# Patient Record
Sex: Female | Born: 1965
Health system: Southern US, Community
[De-identification: ages and names within clinical notes are randomized; demographics above are authoritative.]

## PROBLEM LIST (undated history)

## (undated) ENCOUNTER — Emergency Department: Admission: EM | Source: Home / Self Care

## (undated) DIAGNOSIS — M549 Dorsalgia, unspecified: Secondary | ICD-10-CM

## (undated) DIAGNOSIS — G43909 Migraine, unspecified, not intractable, without status migrainosus: Secondary | ICD-10-CM

## (undated) DIAGNOSIS — R42 Dizziness and giddiness: Secondary | ICD-10-CM

## (undated) HISTORY — PX: ABDOMINAL HYSTERECTOMY: SHX81

---

## 2006-06-22 ENCOUNTER — Ambulatory Visit: Payer: Self-pay

## 2007-07-01 ENCOUNTER — Ambulatory Visit: Payer: Self-pay

## 2008-05-29 ENCOUNTER — Emergency Department (HOSPITAL_COMMUNITY): Admission: EM | Admit: 2008-05-29 | Discharge: 2008-05-29 | Payer: Self-pay | Admitting: Family Medicine

## 2008-07-03 ENCOUNTER — Ambulatory Visit: Payer: Self-pay

## 2009-06-13 ENCOUNTER — Encounter: Admission: RE | Admit: 2009-06-13 | Discharge: 2009-08-09 | Payer: Self-pay | Admitting: Occupational Medicine

## 2009-06-29 ENCOUNTER — Ambulatory Visit (HOSPITAL_COMMUNITY): Admission: RE | Admit: 2009-06-29 | Discharge: 2009-06-29 | Payer: Self-pay | Admitting: Internal Medicine

## 2009-07-04 ENCOUNTER — Ambulatory Visit: Payer: Self-pay

## 2009-11-15 ENCOUNTER — Ambulatory Visit: Payer: Self-pay | Admitting: Unknown Physician Specialty

## 2010-09-21 ENCOUNTER — Encounter: Payer: Self-pay | Admitting: Unknown Physician Specialty

## 2010-09-22 ENCOUNTER — Encounter: Payer: Self-pay | Admitting: Internal Medicine

## 2013-01-17 ENCOUNTER — Encounter (HOSPITAL_COMMUNITY): Payer: Self-pay | Admitting: *Deleted

## 2013-01-17 ENCOUNTER — Emergency Department (HOSPITAL_COMMUNITY)
Admission: EM | Admit: 2013-01-17 | Discharge: 2013-01-17 | Disposition: A | Discharge status: Home / Self Care | Payer: Self-pay | Attending: Emergency Medicine | Admitting: Emergency Medicine

## 2013-01-17 DIAGNOSIS — M545 Low back pain, unspecified: Secondary | ICD-10-CM | POA: Insufficient documentation

## 2013-01-17 DIAGNOSIS — Z8679 Personal history of other diseases of the circulatory system: Secondary | ICD-10-CM | POA: Insufficient documentation

## 2013-01-17 DIAGNOSIS — M549 Dorsalgia, unspecified: Secondary | ICD-10-CM

## 2013-01-17 DIAGNOSIS — F17200 Nicotine dependence, unspecified, uncomplicated: Secondary | ICD-10-CM | POA: Insufficient documentation

## 2013-01-17 HISTORY — DX: Dorsalgia, unspecified: M54.9

## 2013-01-17 HISTORY — DX: Migraine, unspecified, not intractable, without status migrainosus: G43.909

## 2013-01-17 HISTORY — DX: Dizziness and giddiness: R42

## 2013-01-17 MED ORDER — CYCLOBENZAPRINE HCL 10 MG PO TABS: 10.0000 mg | ORAL_TABLET | Freq: Two times a day (BID) | ORAL | Status: AC | PRN

## 2013-01-17 MED ORDER — HYDROCODONE-ACETAMINOPHEN 5-325 MG PO TABS: 1.0000 | ORAL_TABLET | Freq: Four times a day (QID) | ORAL | Status: AC | PRN

## 2013-01-17 MED ORDER — NAPROXEN 500 MG PO TABS: 500.0000 mg | ORAL_TABLET | Freq: Two times a day (BID) | ORAL | Status: AC

## 2013-01-17 MED ORDER — HYDROCODONE-ACETAMINOPHEN 5-325 MG PO TABS
1.0000 | ORAL_TABLET | Freq: Once | ORAL | Status: AC
  Administered 2013-01-17: 1 via ORAL
  Filled 2013-01-17: qty 1

## 2013-01-17 NOTE — ED Notes (Signed)
MD at bedside. 

## 2013-01-17 NOTE — ED Notes (Signed)
Chronic lumbar back pain, flare up Friday.  Normally on Meloxicam and Cyclobenzaprine, but is out of meds.  Hx of herniated disc w/nerve impingement.

## 2013-01-17 NOTE — ED Provider Notes (Signed)
History     This chart was scribed for Shelda Jakes, MD, MD by Smitty Pluck, ED Scribe. The patient was seen in room APA03/APA03 and the patient's care was started at 11:14 AM.   CSN: [id number]  Arrival date & time 01/17/13  6213      Chief Complaint  Patient presents with  . Back Pain    Patient is a 47 y.o. female presenting with back pain. The history is provided by the patient and medical records. No language interpreter was used.  Back Pain Location:  Lumbar spine Quality:  Unable to specify Radiates to:  R knee Pain severity:  Moderate Onset quality:  Sudden Duration:  3 days Timing:  Constant Progression:  Worsening Chronicity:  Chronic Context: not falling, not jumping from heights, not MCA, not MVA and not recent injury   Relieved by:  None tried Ineffective treatments:  None tried Associated symptoms: no dysuria and no fever    HPI Comments: Kathy Choi is a 47 y.o. female who presents to the Emergency Department complaining of constant, moderate lower back pain radiating to right knee and entire left leg that have been chronic for years but worsening 3 days ago. Pt reports that she use to be treated by Dr. Claybon Jabs but is unable to see him because she does not have insurance. Pt states has taken Meloxicam in the past but has not had it within the past year. Pt denies fever, chills, nausea, numbness, vomiting, urinary/bowel incontinence, diarrhea, weakness, cough, SOB and any other pain.   Past Medical History  Diagnosis Date  . Migraines   . Back pain   . Vertigo     Past Surgical History  Procedure Laterality Date  . Abdominal hysterectomy      History reviewed. No pertinent family history.  History  Substance Use Topics  . Smoking status: Current Every Day Smoker -- 0.50 packs/day  . Smokeless tobacco: Not on file  . Alcohol Use: No    OB History   Grav Para Term Preterm Abortions TAB SAB Ect Mult Living   2 2 2              Review  of Systems  Constitutional: Negative for fever and chills.  HENT: Negative for rhinorrhea.   Respiratory: Negative for cough and shortness of breath.   Cardiovascular: Negative for leg swelling.  Gastrointestinal: Negative for nausea, vomiting and diarrhea.  Genitourinary: Negative for dysuria.  Musculoskeletal: Positive for back pain.  All other systems reviewed and are negative.    Allergies  Sulfa antibiotics  Home Medications   Current Outpatient Rx  Name  Route  Sig  Dispense  Refill  . acetaminophen (TYLENOL) 500 MG tablet   Oral   Take 1,000 mg by mouth every 6 (six) hours as needed for pain.         . cyclobenzaprine (FLEXERIL) 10 MG tablet   Oral   Take 1 tablet (10 mg total) by mouth 2 (two) times daily as needed for muscle spasms.   20 tablet   0   . HYDROcodone-acetaminophen (NORCO/VICODIN) 5-325 MG per tablet   Oral   Take 1-2 tablets by mouth every 6 (six) hours as needed for pain.   20 tablet   0   . naproxen (NAPROSYN) 500 MG tablet   Oral   Take 1 tablet (500 mg total) by mouth 2 (two) times daily.   14 tablet   0     BP 146/99  Pulse 98  Temp(Src) 98.5 F (36.9 C) (Oral)  Ht 5\' 4"  (1.626 m)  Wt 190 lb (86.183 kg)  BMI 32.6 kg/m2  SpO2 99%  Physical Exam  Nursing note and vitals reviewed. Constitutional: She is oriented to person, place, and time. She appears well-developed and well-nourished. No distress.  HENT:  Head: Normocephalic and atraumatic.  Eyes: EOM are normal. Pupils are equal, round, and reactive to light.  Neck: Normal range of motion. Neck supple. No tracheal deviation present.  Cardiovascular: Normal rate, regular rhythm and normal heart sounds.   No murmur heard. Pulmonary/Chest: Effort normal. No respiratory distress.  Abdominal: Soft. Bowel sounds are normal. She exhibits no distension. There is no tenderness. There is no rebound.  Musculoskeletal: Normal range of motion.  No muscle spasm on right lumbar paraspinal  area or left lumbar paraspinal area Tenderness to left lumbar paraspinal area   Lymphadenopathy:    She has no cervical adenopathy.  Neurological: She is alert and oriented to person, place, and time. No cranial nerve deficit. Coordination normal.  Skin: Skin is warm and dry.  Psychiatric: She has a normal mood and affect. Her behavior is normal.    ED Course  Procedures (including critical care time) DIAGNOSTIC STUDIES: Oxygen Saturation is 99% on room air, normal by my interpretation.    COORDINATION OF CARE: 9:48 AM Discussed ED treatment with pt and pt agrees.  Medications  HYDROcodone-acetaminophen (NORCO/VICODIN) 5-325 MG per tablet 1 tablet (1 tablet Oral Given 01/17/13 1122)      Labs Reviewed - No data to display No results found.   1. Back pain       MDM  Patient with long-standing history of low back pain. Worse exacerbation in the last week. No new injury. Patient has been successfully treated in the past with Flexeril and anti-inflammatories. Pain does radiate down the left leg may represent left-sided sciatica. No numbness or weakness in the left foot. Will treat with anti-inflammatories pain medicine and muscle relaxer.      I personally performed the services described in this documentation, which was scribed in my presence. The recorded information has been reviewed and is accurate.     Shelda Jakes, MD 01/17/13 [phone]

## 2014-07-03 ENCOUNTER — Encounter (HOSPITAL_COMMUNITY): Payer: Self-pay | Admitting: *Deleted

## 2015-01-26 ENCOUNTER — Ambulatory Visit (HOSPITAL_COMMUNITY)
Admission: RE | Admit: 2015-01-26 | Discharge: 2015-01-26 | Disposition: A | Discharge status: Home / Self Care | Payer: Self-pay | Source: Ambulatory Visit | Attending: Nurse Practitioner | Admitting: Nurse Practitioner

## 2015-01-26 ENCOUNTER — Other Ambulatory Visit (HOSPITAL_COMMUNITY): Payer: Self-pay | Admitting: Nurse Practitioner

## 2015-01-26 DIAGNOSIS — M5440 Lumbago with sciatica, unspecified side: Secondary | ICD-10-CM

## 2015-03-23 ENCOUNTER — Other Ambulatory Visit (HOSPITAL_COMMUNITY): Payer: Self-pay | Admitting: Nurse Practitioner

## 2015-03-23 DIAGNOSIS — Z1231 Encounter for screening mammogram for malignant neoplasm of breast: Secondary | ICD-10-CM

## 2015-04-04 ENCOUNTER — Ambulatory Visit (HOSPITAL_COMMUNITY)
Admission: RE | Admit: 2015-04-04 | Discharge: 2015-04-04 | Disposition: A | Discharge status: Home / Self Care | Payer: Self-pay | Source: Ambulatory Visit | Attending: Nurse Practitioner | Admitting: Nurse Practitioner

## 2015-04-04 DIAGNOSIS — Z1231 Encounter for screening mammogram for malignant neoplasm of breast: Secondary | ICD-10-CM

## 2015-04-05 ENCOUNTER — Other Ambulatory Visit: Payer: Self-pay | Admitting: Nurse Practitioner

## 2015-04-05 DIAGNOSIS — R928 Other abnormal and inconclusive findings on diagnostic imaging of breast: Secondary | ICD-10-CM

## 2015-04-10 ENCOUNTER — Ambulatory Visit: Payer: Self-pay | Admitting: Orthopedic Surgery

## 2015-04-17 ENCOUNTER — Ambulatory Visit (HOSPITAL_COMMUNITY)
Admission: RE | Admit: 2015-04-17 | Discharge: 2015-04-17 | Disposition: A | Discharge status: Home / Self Care | Payer: Self-pay | Source: Ambulatory Visit | Attending: Nurse Practitioner | Admitting: Nurse Practitioner

## 2015-04-17 ENCOUNTER — Ambulatory Visit (INDEPENDENT_AMBULATORY_CARE_PROVIDER_SITE_OTHER): Payer: Self-pay | Admitting: Orthopedic Surgery

## 2015-04-17 ENCOUNTER — Encounter: Payer: Self-pay | Admitting: Orthopedic Surgery

## 2015-04-17 VITALS — BP 127/86 | Ht 64.0 in | Wt 189.0 lb

## 2015-04-17 DIAGNOSIS — M5126 Other intervertebral disc displacement, lumbar region: Secondary | ICD-10-CM

## 2015-04-17 DIAGNOSIS — R928 Other abnormal and inconclusive findings on diagnostic imaging of breast: Secondary | ICD-10-CM

## 2015-04-17 NOTE — Progress Notes (Signed)
[facility]   Chief Complaint  Patient presents with  . Back Pain    Low back pain, referred by Dr. Sedonia Small for eval and treat.    History. The patient presents with a seven-year history of chronic back pain she says it always a 10. The pain is located in the lower back and radiates down the left leg into the left foot. She says the whole leg feels numb. Severity again is a 10 duration as stated quality of pain described as sharp dull throbbing burning stabbing aching radiating associated with numbness tingling giving out of the left leg stiffness of the back and locking  Previous treatment I am cortisone shot 3, physical therapy, medications of cyclobenzaprine and diclofenac.  Patient reports inability to sit for long periods stand for long periods walk or lift anything of any weight.  Review of systems dental problems vision problems or neurologic problems as stated depression anxiety skin rashes  Past Medical History  Diagnosis Date  . Migraines   . Back pain   . Vertigo     BP 127/86 mmHg  Ht  (1.626 m)  Wt 189 lb (85.73 kg)  BMI 32.43 kg/m2 She is a well-developed well-nourished her overall body habitus is ectomorphic Oriented 3 mood pleasant Gait unremarkable  Lumbar spine tenderness thoracic spine no tenderness cervical spine no tenderness, lumbar spine tenderness is severe she has left gluteal tenderness left SI joint tenderness  The right and left lower extremity her foot reveal normal strength at the ankle and knee weakness at the left hip flexor Hip knees and ankles are stable for range of motion passively  Skin normal back and legs  Decreased sensation left leg L5 normal on the right  Dorsalis pedis 2+ in each leg/foot  Imaging She presented with lumbar spine films and report. I reviewed these as and independently look at this as mild degenerative disc disease  Recommend MRI to evaluate her spine.  X-ray results will be called to the  patient and then referral will be made

## 2015-04-17 NOTE — Patient Instructions (Signed)
We will schedule MRI for you and call you with appt and results 

## 2015-05-02 ENCOUNTER — Ambulatory Visit (HOSPITAL_COMMUNITY)
Admission: RE | Admit: 2015-05-02 | Discharge: 2015-05-02 | Disposition: A | Discharge status: Home / Self Care | Payer: Self-pay | Source: Ambulatory Visit | Attending: Orthopedic Surgery | Admitting: Orthopedic Surgery

## 2015-05-02 DIAGNOSIS — M5136 Other intervertebral disc degeneration, lumbar region: Secondary | ICD-10-CM | POA: Insufficient documentation

## 2015-05-02 DIAGNOSIS — M545 Low back pain: Secondary | ICD-10-CM | POA: Insufficient documentation

## 2015-05-02 DIAGNOSIS — M5126 Other intervertebral disc displacement, lumbar region: Secondary | ICD-10-CM

## 2015-05-02 DIAGNOSIS — M79605 Pain in left leg: Secondary | ICD-10-CM | POA: Insufficient documentation

## 2015-05-02 DIAGNOSIS — M1288 Other specific arthropathies, not elsewhere classified, other specified site: Secondary | ICD-10-CM | POA: Insufficient documentation

## 2015-05-22 ENCOUNTER — Telehealth: Payer: Self-pay | Admitting: Orthopedic Surgery

## 2015-05-22 NOTE — Telephone Encounter (Signed)
My note says its a back problem   So neurosurgery

## 2015-05-22 NOTE — Telephone Encounter (Signed)
What type of referral is this patient speaking of, i do not see it in the chart or notes?

## 2015-05-22 NOTE — Telephone Encounter (Signed)
Patient returned call per Dr Mort Sawyers message regarding MRI results; said was advised about referral?  Patient verified that she is still under [facility] care through [facility]; in this case, protocol is to have [facility] contact person, Ms. Valentina Lucks, make the referral.  Patient's ph#: [phone]

## 2015-05-23 NOTE — Telephone Encounter (Signed)
Dr Mort Sawyers recommendation faxed to [facility] @ [facility]

## 2015-06-12 ENCOUNTER — Telehealth: Payer: Self-pay | Admitting: Orthopedic Surgery

## 2015-06-12 NOTE — Telephone Encounter (Signed)
Notes refaxed as requested

## 2015-06-12 NOTE — Telephone Encounter (Signed)
Call received from [facility] of [facility] [employer], inquiring about a referral. States patient called her to follow up; please fax or re-fax to her attention to Fax#[phone]

## 2015-11-09 ENCOUNTER — Other Ambulatory Visit (HOSPITAL_COMMUNITY): Payer: Self-pay | Admitting: Internal Medicine

## 2015-11-09 DIAGNOSIS — N9489 Other specified conditions associated with female genital organs and menstrual cycle: Secondary | ICD-10-CM

## 2015-11-13 ENCOUNTER — Ambulatory Visit (HOSPITAL_COMMUNITY): Admission: RE | Admit: 2015-11-13 | Payer: Self-pay | Source: Ambulatory Visit

## 2016-12-12 IMAGING — MG MM SCREENING BREAST TOMO BILATERAL
3 series · 3 of 3 positions shown · non-contrast
Comparison: 07/04/2009

CLINICAL DATA: Screening.

EXAM:
DIGITAL SCREENING BILATERAL MAMMOGRAM WITH CAD

[R CC]
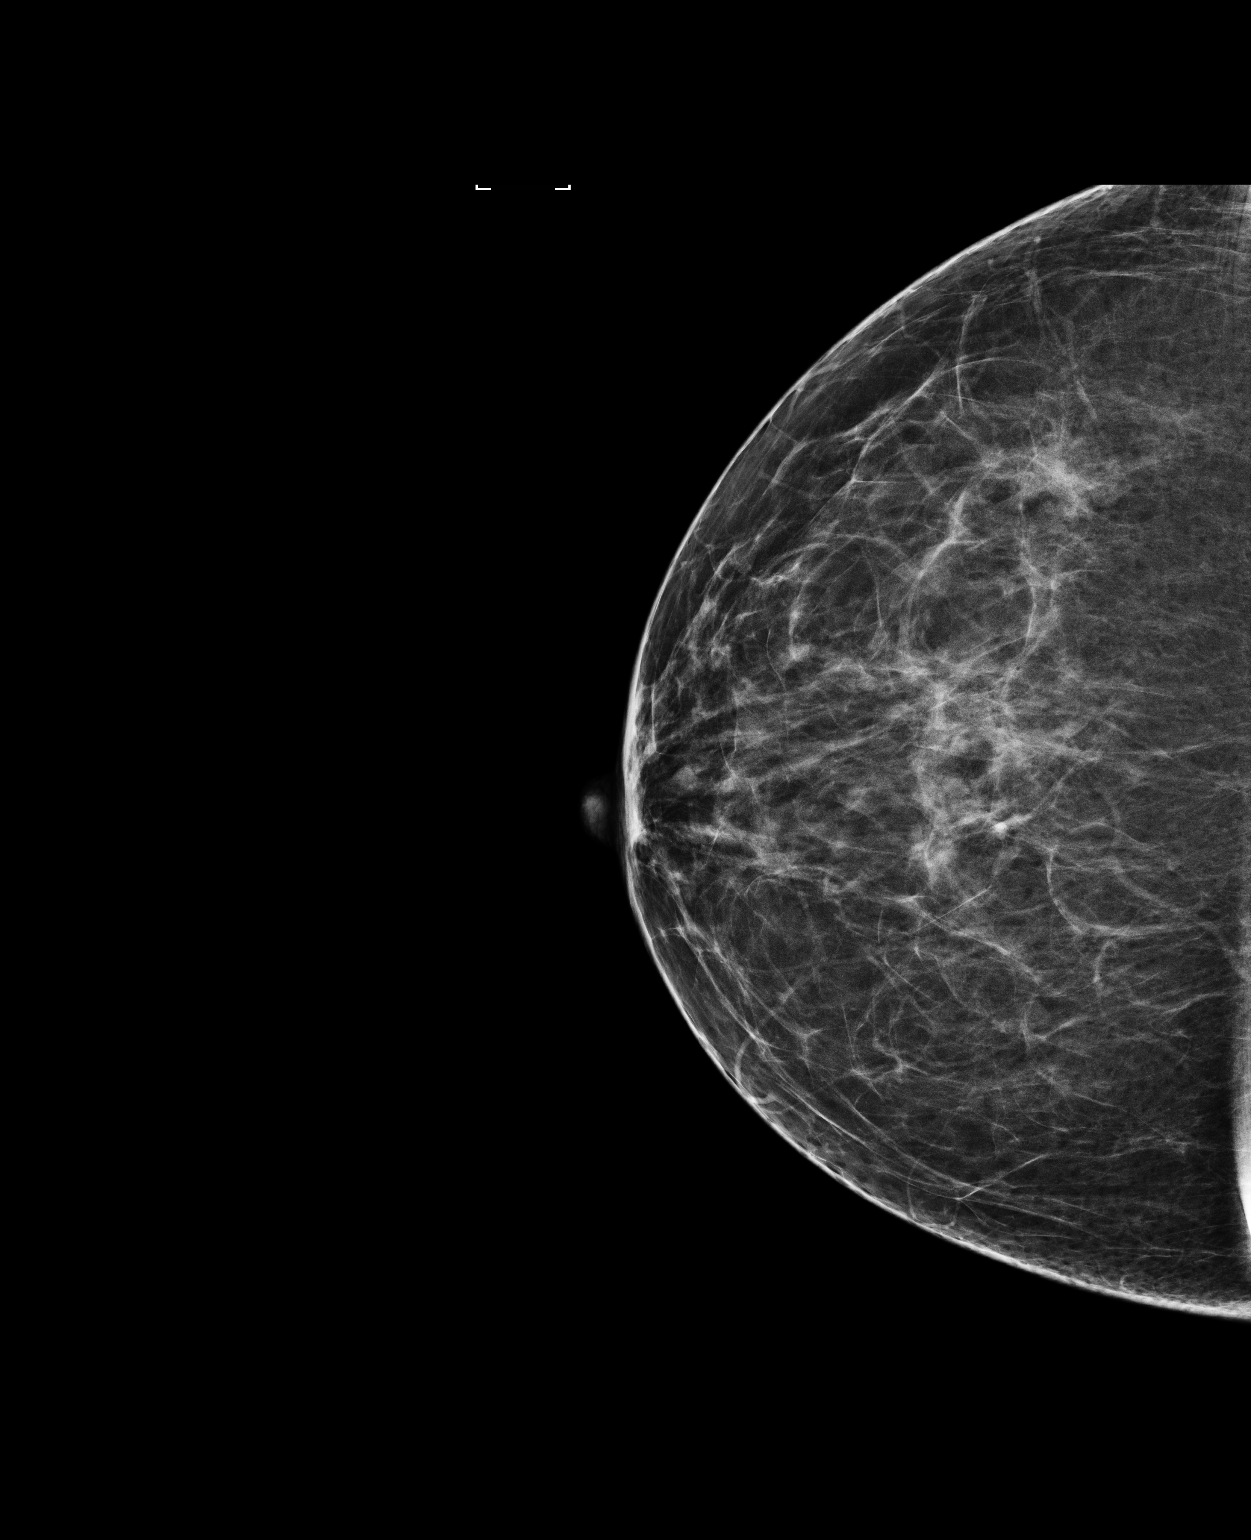

[L CC]
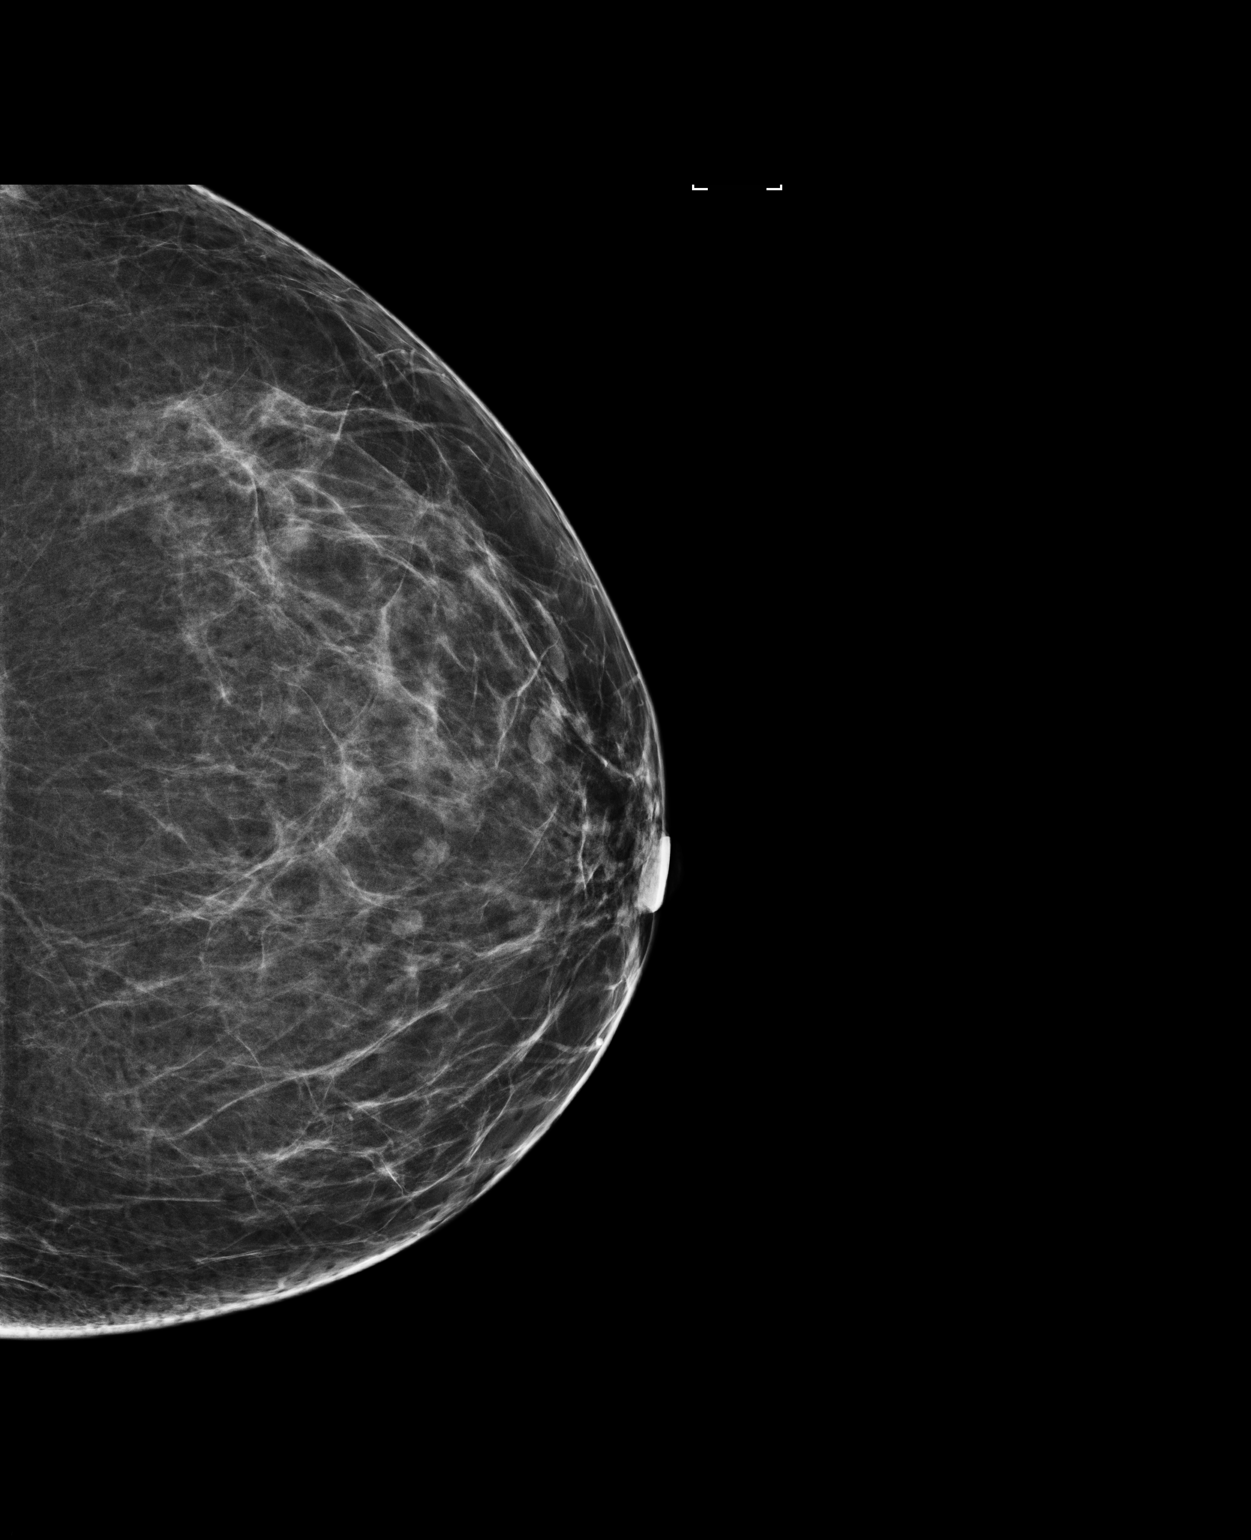

[R MLO]
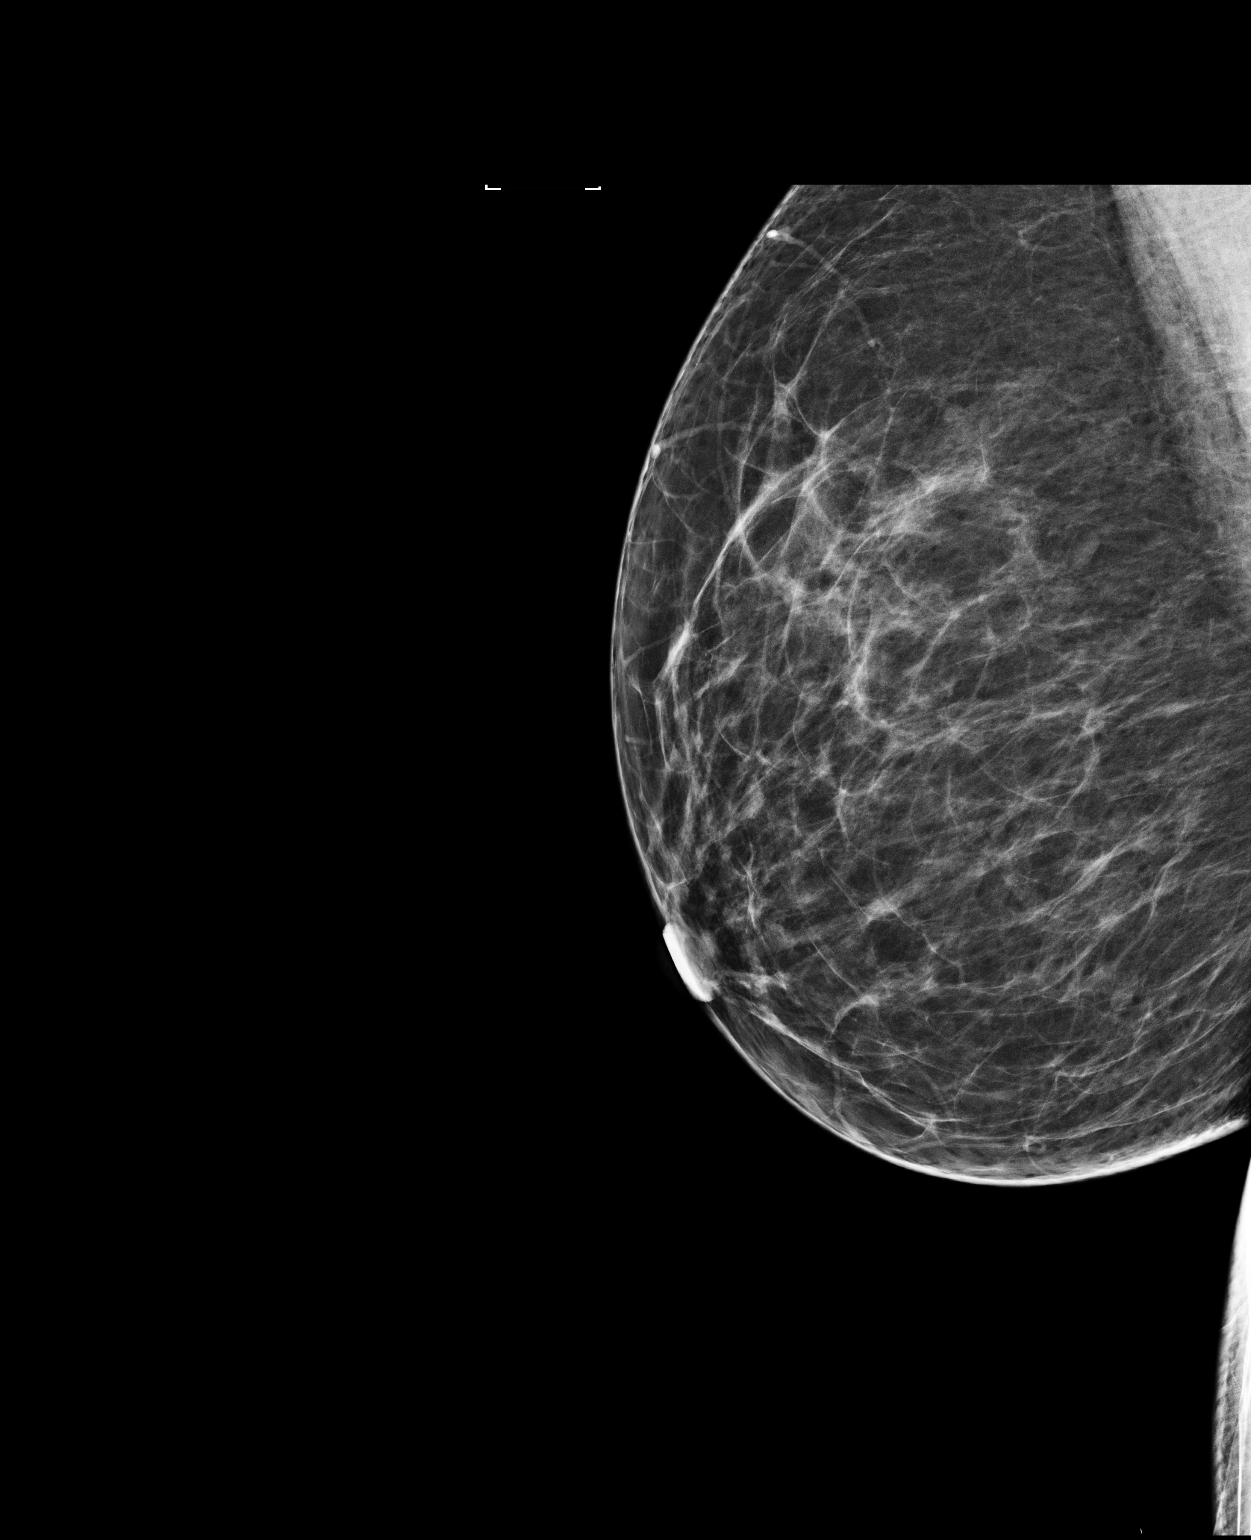

[3 of 3 positions shown; findings below may reference images not displayed]

ACR Breast Density Category b: There are scattered areas of
fibroglandular density.
FINDINGS: In the left breast, a possible asymmetry warrants further
evaluation. In the right breast, no findings suspicious for
malignancy. Images were processed with CAD.
IMPRESSION: Further evaluation is suggested for possible asymmetry in the left
breast.

RECOMMENDATION:
Diagnostic mammogram and possibly ultrasound of the left breast.
(Code:[id number])

The patient will be contacted regarding the findings, and additional
imaging will be scheduled.

BI-RADS CATEGORY  0: Incomplete. Need additional imaging evaluation
and/or prior mammograms for comparison.

## 2018-06-01 ENCOUNTER — Ambulatory Visit: Payer: Self-pay | Admitting: Family Medicine

## 2018-06-21 ENCOUNTER — Encounter

## 2018-06-21 ENCOUNTER — Ambulatory Visit: Payer: Self-pay | Admitting: Family Medicine

## 2018-07-26 DIAGNOSIS — I10 Essential (primary) hypertension: Secondary | ICD-10-CM | POA: Diagnosis not present

## 2018-07-26 DIAGNOSIS — Z23 Encounter for immunization: Secondary | ICD-10-CM | POA: Diagnosis not present

## 2018-07-26 DIAGNOSIS — Z6832 Body mass index (BMI) 32.0-32.9, adult: Secondary | ICD-10-CM | POA: Diagnosis not present

## 2018-07-26 DIAGNOSIS — F17218 Nicotine dependence, cigarettes, with other nicotine-induced disorders: Secondary | ICD-10-CM | POA: Diagnosis not present

## 2018-08-05 DIAGNOSIS — E782 Mixed hyperlipidemia: Secondary | ICD-10-CM | POA: Diagnosis not present

## 2018-08-05 DIAGNOSIS — Z131 Encounter for screening for diabetes mellitus: Secondary | ICD-10-CM | POA: Diagnosis not present

## 2018-08-05 DIAGNOSIS — E669 Obesity, unspecified: Secondary | ICD-10-CM | POA: Diagnosis not present

## 2018-08-05 DIAGNOSIS — E559 Vitamin D deficiency, unspecified: Secondary | ICD-10-CM | POA: Diagnosis not present

## 2018-08-06 DIAGNOSIS — Z1211 Encounter for screening for malignant neoplasm of colon: Secondary | ICD-10-CM | POA: Diagnosis not present

## 2018-08-06 DIAGNOSIS — Z1212 Encounter for screening for malignant neoplasm of rectum: Secondary | ICD-10-CM | POA: Diagnosis not present

## 2018-08-09 ENCOUNTER — Other Ambulatory Visit (HOSPITAL_COMMUNITY): Payer: Self-pay | Admitting: Internal Medicine

## 2018-08-09 ENCOUNTER — Ambulatory Visit (HOSPITAL_COMMUNITY)
Admission: RE | Admit: 2018-08-09 | Discharge: 2018-08-09 | Disposition: A | Discharge status: Home / Self Care | Payer: BLUE CROSS/BLUE SHIELD | Source: Ambulatory Visit | Attending: Internal Medicine | Admitting: Internal Medicine

## 2018-08-09 DIAGNOSIS — Z13820 Encounter for screening for osteoporosis: Secondary | ICD-10-CM | POA: Diagnosis not present

## 2018-08-09 DIAGNOSIS — Z780 Asymptomatic menopausal state: Secondary | ICD-10-CM

## 2018-08-09 DIAGNOSIS — Z1231 Encounter for screening mammogram for malignant neoplasm of breast: Secondary | ICD-10-CM | POA: Insufficient documentation

## 2018-08-12 DIAGNOSIS — Z0000 Encounter for general adult medical examination without abnormal findings: Secondary | ICD-10-CM | POA: Diagnosis not present

## 2018-08-12 DIAGNOSIS — E782 Mixed hyperlipidemia: Secondary | ICD-10-CM | POA: Diagnosis not present

## 2018-08-12 DIAGNOSIS — I10 Essential (primary) hypertension: Secondary | ICD-10-CM | POA: Diagnosis not present

## 2018-08-12 DIAGNOSIS — F39 Unspecified mood [affective] disorder: Secondary | ICD-10-CM | POA: Diagnosis not present

## 2019-02-01 DIAGNOSIS — Z6832 Body mass index (BMI) 32.0-32.9, adult: Secondary | ICD-10-CM | POA: Diagnosis not present

## 2019-02-01 DIAGNOSIS — E782 Mixed hyperlipidemia: Secondary | ICD-10-CM | POA: Diagnosis not present

## 2019-02-01 DIAGNOSIS — F39 Unspecified mood [affective] disorder: Secondary | ICD-10-CM | POA: Diagnosis not present

## 2019-02-01 DIAGNOSIS — I10 Essential (primary) hypertension: Secondary | ICD-10-CM | POA: Diagnosis not present

## 2019-02-01 DIAGNOSIS — E669 Obesity, unspecified: Secondary | ICD-10-CM | POA: Diagnosis not present

## 2019-02-01 DIAGNOSIS — Z0000 Encounter for general adult medical examination without abnormal findings: Secondary | ICD-10-CM | POA: Diagnosis not present

## 2019-02-10 DIAGNOSIS — F39 Unspecified mood [affective] disorder: Secondary | ICD-10-CM | POA: Diagnosis not present

## 2019-02-10 DIAGNOSIS — E559 Vitamin D deficiency, unspecified: Secondary | ICD-10-CM | POA: Diagnosis not present

## 2019-02-10 DIAGNOSIS — I10 Essential (primary) hypertension: Secondary | ICD-10-CM | POA: Diagnosis not present

## 2019-02-10 DIAGNOSIS — E782 Mixed hyperlipidemia: Secondary | ICD-10-CM | POA: Diagnosis not present

## 2019-08-24 DIAGNOSIS — E559 Vitamin D deficiency, unspecified: Secondary | ICD-10-CM | POA: Diagnosis not present

## 2019-08-24 DIAGNOSIS — Z6832 Body mass index (BMI) 32.0-32.9, adult: Secondary | ICD-10-CM | POA: Diagnosis not present

## 2019-08-24 DIAGNOSIS — E782 Mixed hyperlipidemia: Secondary | ICD-10-CM | POA: Diagnosis not present

## 2019-08-24 DIAGNOSIS — I10 Essential (primary) hypertension: Secondary | ICD-10-CM | POA: Diagnosis not present

## 2019-08-24 DIAGNOSIS — Z0000 Encounter for general adult medical examination without abnormal findings: Secondary | ICD-10-CM | POA: Diagnosis not present

## 2019-08-24 DIAGNOSIS — E669 Obesity, unspecified: Secondary | ICD-10-CM | POA: Diagnosis not present

## 2019-09-01 DIAGNOSIS — G43909 Migraine, unspecified, not intractable, without status migrainosus: Secondary | ICD-10-CM | POA: Diagnosis not present

## 2019-09-01 DIAGNOSIS — E559 Vitamin D deficiency, unspecified: Secondary | ICD-10-CM | POA: Diagnosis not present

## 2019-09-01 DIAGNOSIS — E782 Mixed hyperlipidemia: Secondary | ICD-10-CM | POA: Diagnosis not present

## 2019-09-01 DIAGNOSIS — I10 Essential (primary) hypertension: Secondary | ICD-10-CM | POA: Diagnosis not present

## 2019-12-07 DIAGNOSIS — E782 Mixed hyperlipidemia: Secondary | ICD-10-CM | POA: Diagnosis not present

## 2019-12-07 DIAGNOSIS — E669 Obesity, unspecified: Secondary | ICD-10-CM | POA: Diagnosis not present

## 2019-12-07 DIAGNOSIS — F17218 Nicotine dependence, cigarettes, with other nicotine-induced disorders: Secondary | ICD-10-CM | POA: Diagnosis not present

## 2019-12-07 DIAGNOSIS — E559 Vitamin D deficiency, unspecified: Secondary | ICD-10-CM | POA: Diagnosis not present

## 2019-12-07 DIAGNOSIS — I10 Essential (primary) hypertension: Secondary | ICD-10-CM | POA: Diagnosis not present

## 2019-12-07 DIAGNOSIS — R7301 Impaired fasting glucose: Secondary | ICD-10-CM | POA: Diagnosis not present

## 2019-12-12 DIAGNOSIS — E782 Mixed hyperlipidemia: Secondary | ICD-10-CM | POA: Diagnosis not present

## 2019-12-12 DIAGNOSIS — I10 Essential (primary) hypertension: Secondary | ICD-10-CM | POA: Diagnosis not present

## 2019-12-12 DIAGNOSIS — E559 Vitamin D deficiency, unspecified: Secondary | ICD-10-CM | POA: Diagnosis not present

## 2019-12-12 DIAGNOSIS — G43909 Migraine, unspecified, not intractable, without status migrainosus: Secondary | ICD-10-CM | POA: Diagnosis not present

## 2020-04-18 IMAGING — MG DIGITAL SCREENING BILATERAL MAMMOGRAM WITH TOMO AND CAD
8 series · 8 of 24 positions shown · non-contrast
Comparison: Previous exam(s).

CLINICAL DATA: Screening.

EXAM:
DIGITAL SCREENING BILATERAL MAMMOGRAM WITH TOMO AND CAD

[R MLO synth-2D]
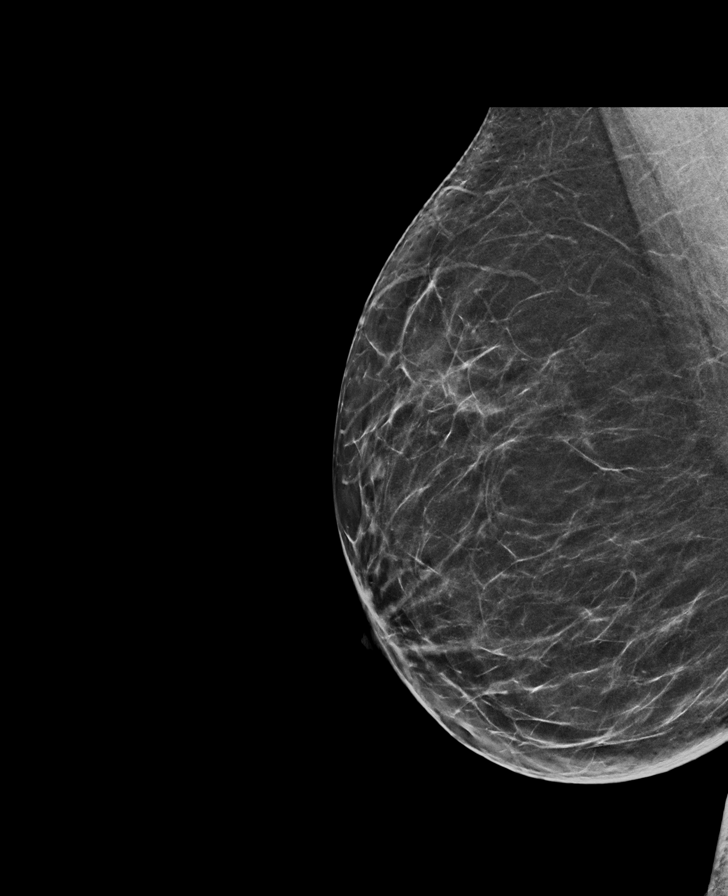

[L MLO synth-2D]
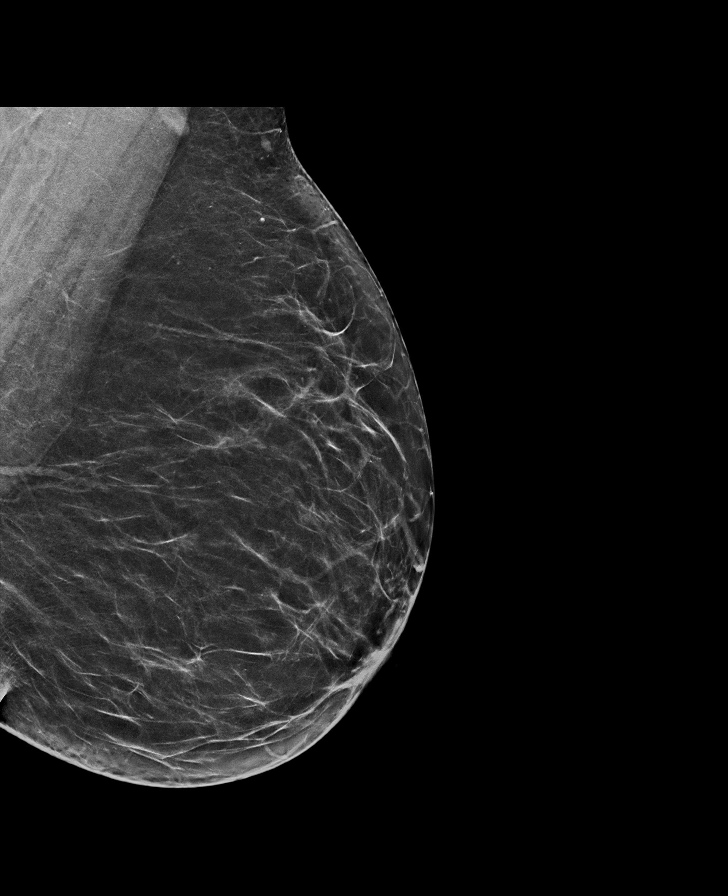

[L CC synth-2D]
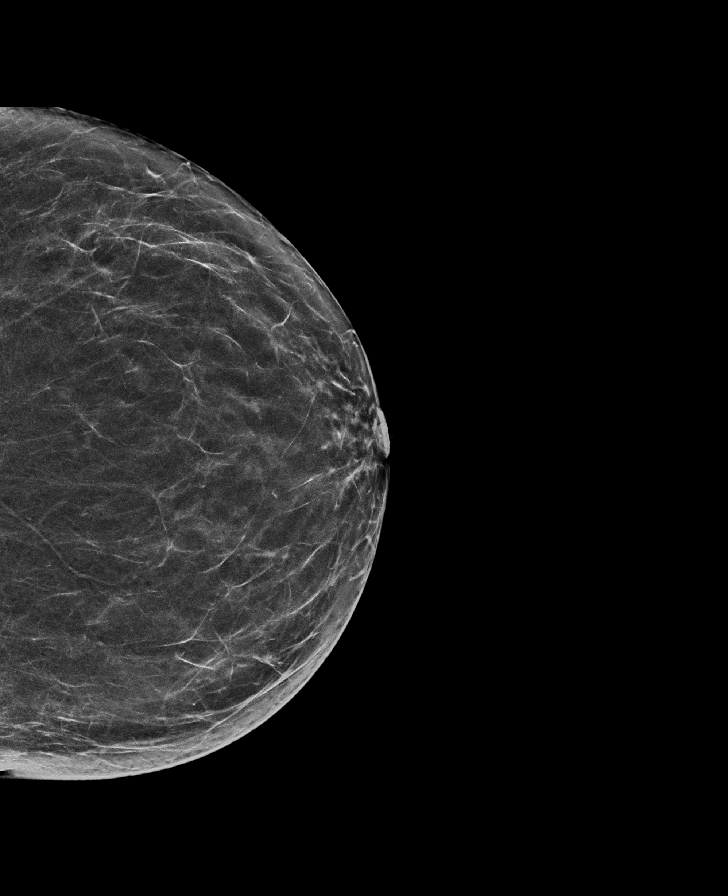

[R CC synth-2D]
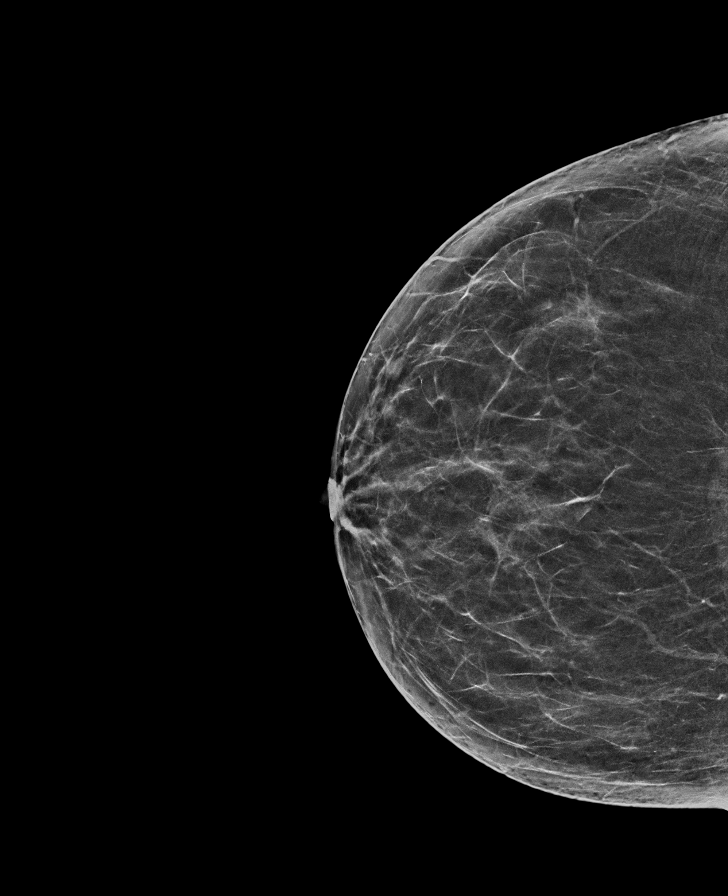

[R CC tomo · tomo slice 33/64.0]
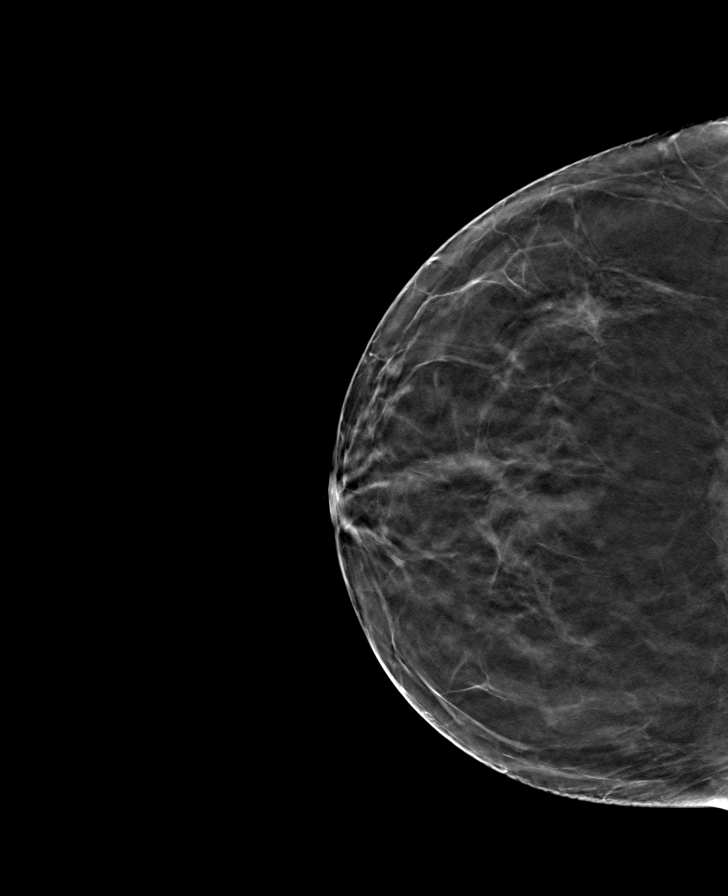

[L CC tomo · tomo slice 33/66.0]
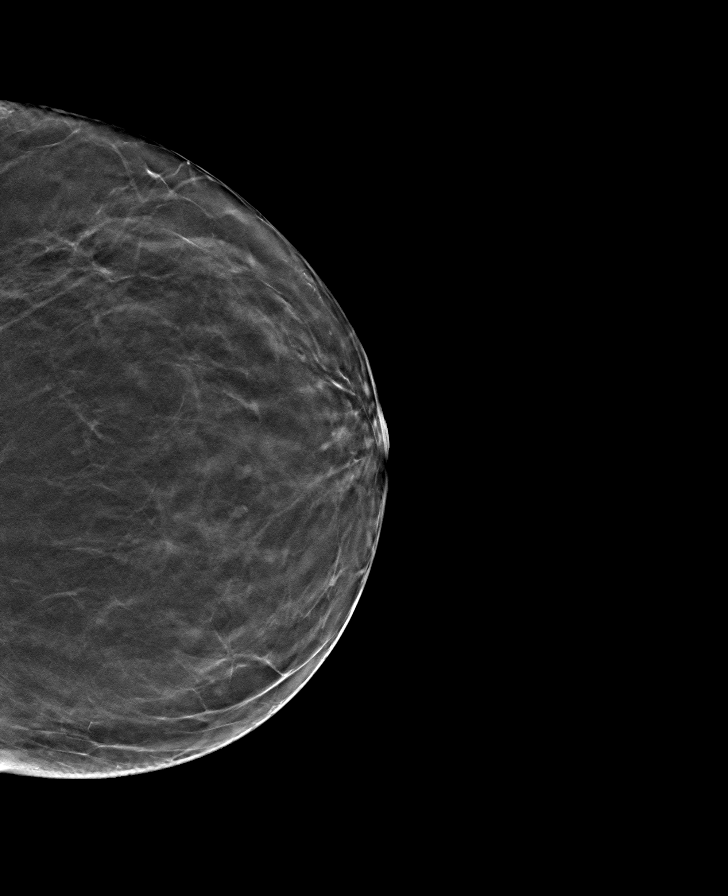

[L MLO tomo · tomo slice 37/74.0]
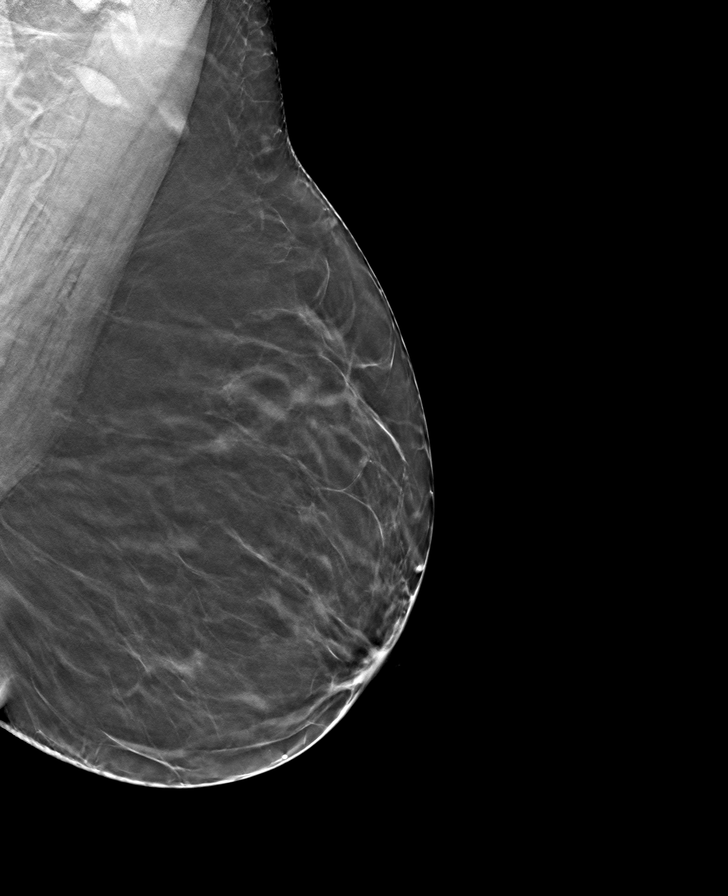

[R MLO tomo · tomo slice 33/66.0]
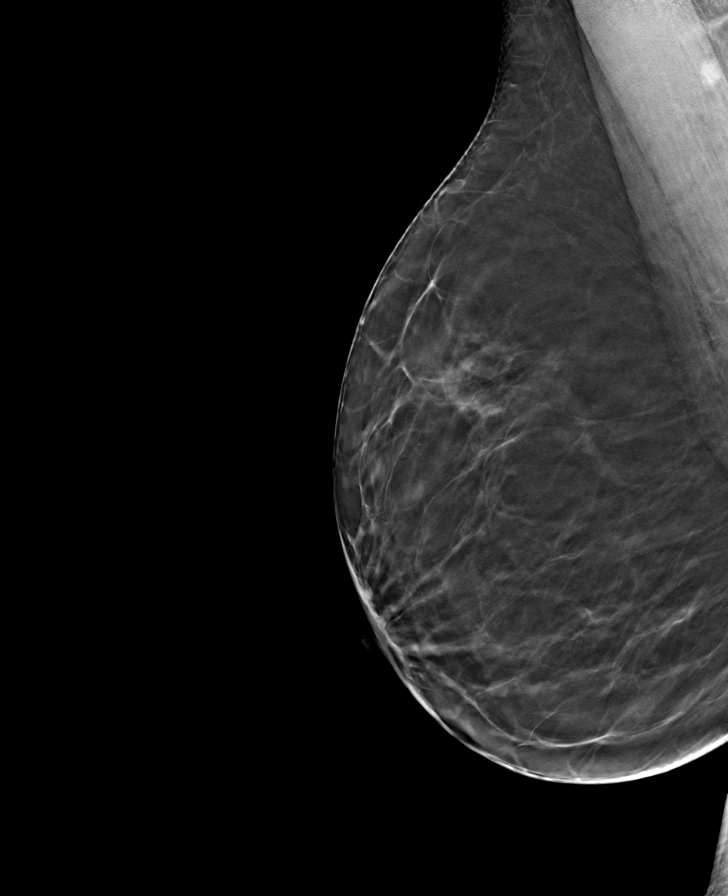

[8 of 24 positions shown; findings below may reference images not displayed]

ACR Breast Density Category b: There are scattered areas of
fibroglandular density.
FINDINGS: There are no findings suspicious for malignancy. Images were
processed with CAD.
IMPRESSION: No mammographic evidence of malignancy. A result letter of this
screening mammogram will be mailed directly to the patient.

RECOMMENDATION:
Screening mammogram in one year. (Code:[id number])

BI-RADS CATEGORY  1: Negative.

## 2020-06-05 DIAGNOSIS — E559 Vitamin D deficiency, unspecified: Secondary | ICD-10-CM | POA: Diagnosis not present

## 2020-06-05 DIAGNOSIS — E782 Mixed hyperlipidemia: Secondary | ICD-10-CM | POA: Diagnosis not present

## 2020-06-05 DIAGNOSIS — I10 Essential (primary) hypertension: Secondary | ICD-10-CM | POA: Diagnosis not present

## 2020-06-05 DIAGNOSIS — Z0000 Encounter for general adult medical examination without abnormal findings: Secondary | ICD-10-CM | POA: Diagnosis not present

## 2020-06-05 DIAGNOSIS — E669 Obesity, unspecified: Secondary | ICD-10-CM | POA: Diagnosis not present

## 2020-06-05 DIAGNOSIS — F329 Major depressive disorder, single episode, unspecified: Secondary | ICD-10-CM | POA: Diagnosis not present

## 2020-06-25 DIAGNOSIS — I10 Essential (primary) hypertension: Secondary | ICD-10-CM | POA: Diagnosis not present

## 2020-06-25 DIAGNOSIS — E559 Vitamin D deficiency, unspecified: Secondary | ICD-10-CM | POA: Diagnosis not present

## 2020-06-25 DIAGNOSIS — G43909 Migraine, unspecified, not intractable, without status migrainosus: Secondary | ICD-10-CM | POA: Diagnosis not present

## 2020-06-25 DIAGNOSIS — E782 Mixed hyperlipidemia: Secondary | ICD-10-CM | POA: Diagnosis not present

## 2020-07-11 ENCOUNTER — Other Ambulatory Visit (HOSPITAL_COMMUNITY): Payer: Self-pay | Admitting: Internal Medicine

## 2020-07-11 DIAGNOSIS — Z1231 Encounter for screening mammogram for malignant neoplasm of breast: Secondary | ICD-10-CM

## 2020-10-29 DIAGNOSIS — E782 Mixed hyperlipidemia: Secondary | ICD-10-CM | POA: Diagnosis not present

## 2020-10-29 DIAGNOSIS — E559 Vitamin D deficiency, unspecified: Secondary | ICD-10-CM | POA: Diagnosis not present

## 2020-10-29 DIAGNOSIS — E669 Obesity, unspecified: Secondary | ICD-10-CM | POA: Diagnosis not present

## 2020-10-29 DIAGNOSIS — Z0000 Encounter for general adult medical examination without abnormal findings: Secondary | ICD-10-CM | POA: Diagnosis not present

## 2020-10-29 DIAGNOSIS — F329 Major depressive disorder, single episode, unspecified: Secondary | ICD-10-CM | POA: Diagnosis not present

## 2020-10-29 DIAGNOSIS — I10 Essential (primary) hypertension: Secondary | ICD-10-CM | POA: Diagnosis not present

## 2021-08-14 DIAGNOSIS — I10 Essential (primary) hypertension: Secondary | ICD-10-CM | POA: Diagnosis not present

## 2021-08-14 DIAGNOSIS — R5383 Other fatigue: Secondary | ICD-10-CM | POA: Diagnosis not present

## 2021-08-14 DIAGNOSIS — F329 Major depressive disorder, single episode, unspecified: Secondary | ICD-10-CM | POA: Diagnosis not present

## 2021-08-14 DIAGNOSIS — Z1239 Encounter for other screening for malignant neoplasm of breast: Secondary | ICD-10-CM | POA: Diagnosis not present

## 2021-08-14 DIAGNOSIS — Z23 Encounter for immunization: Secondary | ICD-10-CM | POA: Diagnosis not present

## 2022-02-25 ENCOUNTER — Other Ambulatory Visit (HOSPITAL_COMMUNITY): Payer: Self-pay | Admitting: Family Medicine

## 2022-02-25 DIAGNOSIS — Z1231 Encounter for screening mammogram for malignant neoplasm of breast: Secondary | ICD-10-CM

## 2022-05-07 DIAGNOSIS — I10 Essential (primary) hypertension: Secondary | ICD-10-CM | POA: Diagnosis not present

## 2022-05-07 DIAGNOSIS — E785 Hyperlipidemia, unspecified: Secondary | ICD-10-CM | POA: Diagnosis not present

## 2022-05-13 DIAGNOSIS — I10 Essential (primary) hypertension: Secondary | ICD-10-CM | POA: Diagnosis not present

## 2022-05-13 DIAGNOSIS — E559 Vitamin D deficiency, unspecified: Secondary | ICD-10-CM | POA: Diagnosis not present

## 2022-05-13 DIAGNOSIS — Z1239 Encounter for other screening for malignant neoplasm of breast: Secondary | ICD-10-CM | POA: Diagnosis not present

## 2022-05-13 DIAGNOSIS — Z23 Encounter for immunization: Secondary | ICD-10-CM | POA: Diagnosis not present

## 2022-05-13 DIAGNOSIS — F329 Major depressive disorder, single episode, unspecified: Secondary | ICD-10-CM | POA: Diagnosis not present

## 2022-11-06 DIAGNOSIS — I10 Essential (primary) hypertension: Secondary | ICD-10-CM | POA: Diagnosis not present

## 2022-11-06 DIAGNOSIS — E559 Vitamin D deficiency, unspecified: Secondary | ICD-10-CM | POA: Diagnosis not present

## 2022-11-12 ENCOUNTER — Other Ambulatory Visit (HOSPITAL_COMMUNITY): Payer: Self-pay | Admitting: Family Medicine

## 2022-11-12 DIAGNOSIS — Z1231 Encounter for screening mammogram for malignant neoplasm of breast: Secondary | ICD-10-CM

## 2022-11-12 DIAGNOSIS — E663 Overweight: Secondary | ICD-10-CM | POA: Diagnosis not present

## 2022-11-12 DIAGNOSIS — H6122 Impacted cerumen, left ear: Secondary | ICD-10-CM | POA: Diagnosis not present

## 2022-11-12 DIAGNOSIS — Z1239 Encounter for other screening for malignant neoplasm of breast: Secondary | ICD-10-CM | POA: Diagnosis not present

## 2022-11-12 DIAGNOSIS — F329 Major depressive disorder, single episode, unspecified: Secondary | ICD-10-CM | POA: Diagnosis not present

## 2022-11-12 DIAGNOSIS — E559 Vitamin D deficiency, unspecified: Secondary | ICD-10-CM | POA: Diagnosis not present

## 2022-11-12 DIAGNOSIS — Z0001 Encounter for general adult medical examination with abnormal findings: Secondary | ICD-10-CM | POA: Diagnosis not present

## 2022-11-12 DIAGNOSIS — I10 Essential (primary) hypertension: Secondary | ICD-10-CM | POA: Diagnosis not present

## 2022-11-12 DIAGNOSIS — R7989 Other specified abnormal findings of blood chemistry: Secondary | ICD-10-CM | POA: Diagnosis not present

## 2022-11-20 ENCOUNTER — Encounter (HOSPITAL_COMMUNITY): Payer: Self-pay

## 2022-11-20 ENCOUNTER — Ambulatory Visit (HOSPITAL_COMMUNITY)
Admission: RE | Admit: 2022-11-20 | Discharge: 2022-11-20 | Disposition: A | Discharge status: Home / Self Care | Payer: BC Managed Care – PPO | Source: Ambulatory Visit | Attending: Family Medicine | Admitting: Family Medicine

## 2022-11-20 DIAGNOSIS — Z1231 Encounter for screening mammogram for malignant neoplasm of breast: Secondary | ICD-10-CM | POA: Insufficient documentation

## 2022-11-21 DIAGNOSIS — Z1211 Encounter for screening for malignant neoplasm of colon: Secondary | ICD-10-CM | POA: Diagnosis not present

## 2022-11-26 LAB — EXTERNAL GENERIC LAB PROCEDURE: COLOGUARD: NEGATIVE

## 2022-11-26 LAB — COLOGUARD: COLOGUARD: NEGATIVE

## 2023-11-30 DIAGNOSIS — I10 Essential (primary) hypertension: Secondary | ICD-10-CM | POA: Diagnosis not present

## 2023-11-30 DIAGNOSIS — E559 Vitamin D deficiency, unspecified: Secondary | ICD-10-CM | POA: Diagnosis not present

## 2023-12-28 DIAGNOSIS — Z0001 Encounter for general adult medical examination with abnormal findings: Secondary | ICD-10-CM | POA: Diagnosis not present

## 2023-12-28 DIAGNOSIS — Z23 Encounter for immunization: Secondary | ICD-10-CM | POA: Diagnosis not present

## 2023-12-28 DIAGNOSIS — R0789 Other chest pain: Secondary | ICD-10-CM | POA: Diagnosis not present

## 2023-12-28 DIAGNOSIS — Z0000 Encounter for general adult medical examination without abnormal findings: Secondary | ICD-10-CM | POA: Diagnosis not present

## 2023-12-28 DIAGNOSIS — E559 Vitamin D deficiency, unspecified: Secondary | ICD-10-CM | POA: Diagnosis not present

## 2023-12-28 DIAGNOSIS — I10 Essential (primary) hypertension: Secondary | ICD-10-CM | POA: Diagnosis not present

## 2023-12-28 DIAGNOSIS — E663 Overweight: Secondary | ICD-10-CM | POA: Diagnosis not present

## 2023-12-28 DIAGNOSIS — F329 Major depressive disorder, single episode, unspecified: Secondary | ICD-10-CM | POA: Diagnosis not present

## 2024-04-11 DIAGNOSIS — I10 Essential (primary) hypertension: Secondary | ICD-10-CM | POA: Diagnosis not present

## 2024-04-11 DIAGNOSIS — E559 Vitamin D deficiency, unspecified: Secondary | ICD-10-CM | POA: Diagnosis not present

## 2024-04-15 DIAGNOSIS — I10 Essential (primary) hypertension: Secondary | ICD-10-CM | POA: Diagnosis not present

## 2024-04-15 DIAGNOSIS — F329 Major depressive disorder, single episode, unspecified: Secondary | ICD-10-CM | POA: Diagnosis not present

## 2024-04-15 DIAGNOSIS — E663 Overweight: Secondary | ICD-10-CM | POA: Diagnosis not present

## 2024-04-15 DIAGNOSIS — E559 Vitamin D deficiency, unspecified: Secondary | ICD-10-CM | POA: Diagnosis not present

## 2024-04-18 ENCOUNTER — Other Ambulatory Visit (HOSPITAL_COMMUNITY): Payer: Self-pay

## 2024-04-18 DIAGNOSIS — Z1231 Encounter for screening mammogram for malignant neoplasm of breast: Secondary | ICD-10-CM

## 2024-04-25 ENCOUNTER — Ambulatory Visit (HOSPITAL_COMMUNITY)
Admission: RE | Admit: 2024-04-25 | Discharge: 2024-04-25 | Disposition: A | Discharge status: Home / Self Care | Source: Ambulatory Visit

## 2024-04-25 ENCOUNTER — Encounter (HOSPITAL_COMMUNITY): Payer: Self-pay

## 2024-04-25 DIAGNOSIS — Z1231 Encounter for screening mammogram for malignant neoplasm of breast: Secondary | ICD-10-CM | POA: Insufficient documentation
